# Patient Record
Sex: Female | Born: 1995 | Race: Black or African American | Hispanic: No | Marital: Single
Health system: Southern US, Community
[De-identification: ages and names within clinical notes are randomized; demographics above are authoritative.]

## PROBLEM LIST (undated history)

## (undated) ENCOUNTER — Inpatient Hospital Stay (HOSPITAL_COMMUNITY): Payer: Self-pay

## (undated) DIAGNOSIS — A749 Chlamydial infection, unspecified: Secondary | ICD-10-CM

## (undated) DIAGNOSIS — Z789 Other specified health status: Secondary | ICD-10-CM

## (undated) HISTORY — PX: NO PAST SURGERIES: SHX2092

---
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
## 2017-01-22 IMAGING — US US MFM OB FOLLOW-UP
1 series · 14 of 28 positions shown · non-contrast
Comparison: none

[Series 1: us mfm ob follow-up · 48 acquisitions, 14 frames shown]
[im 2/48]
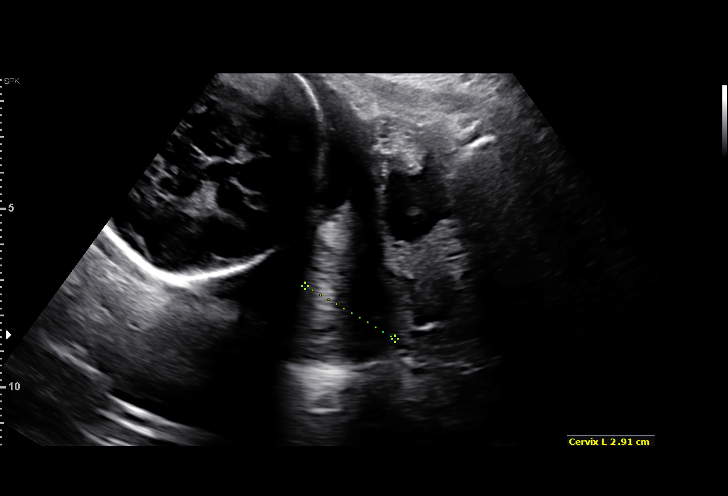
[im 6/48]
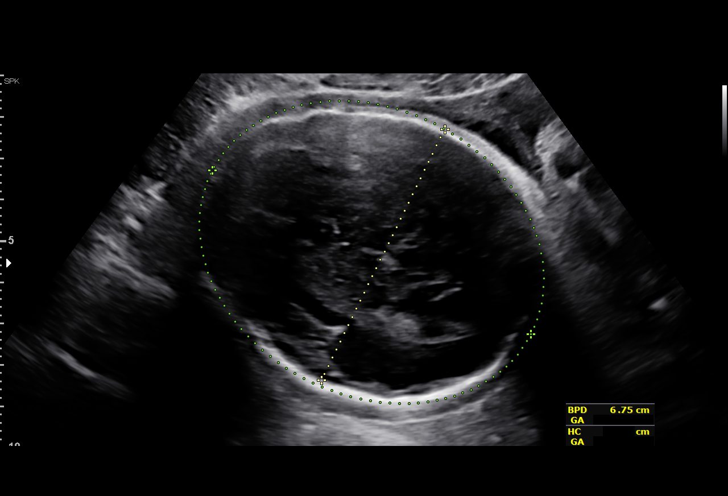
[im 9/48]
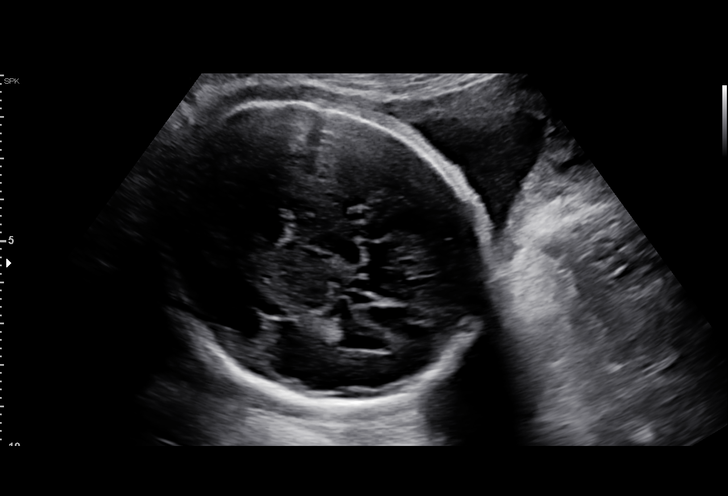
[im 13/48]
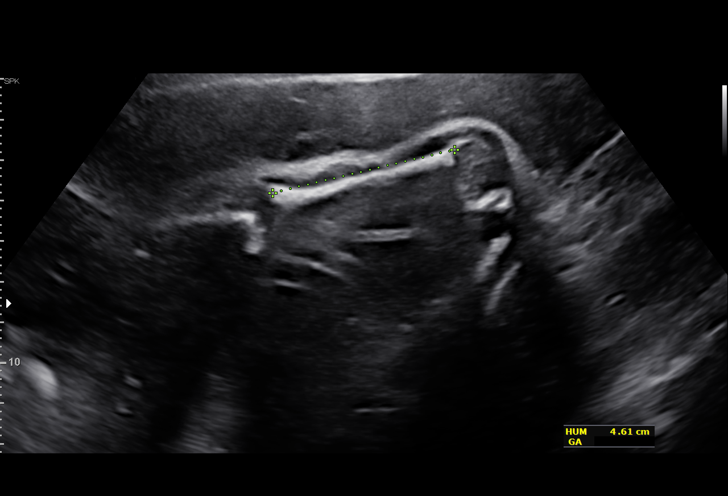
[im 16/48]
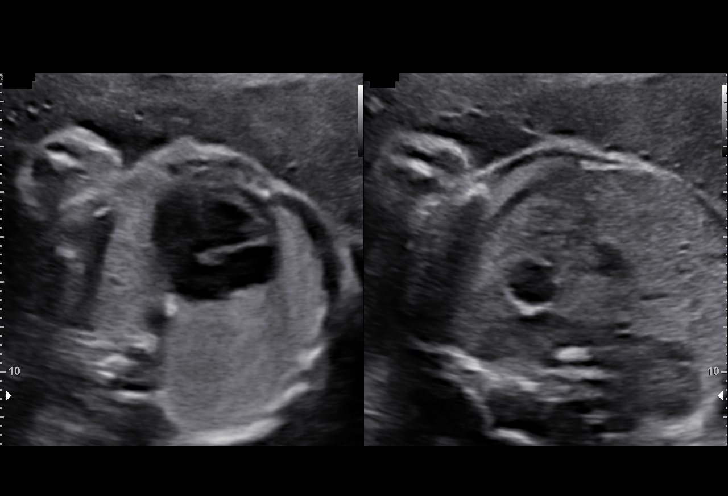
[im 20/48]
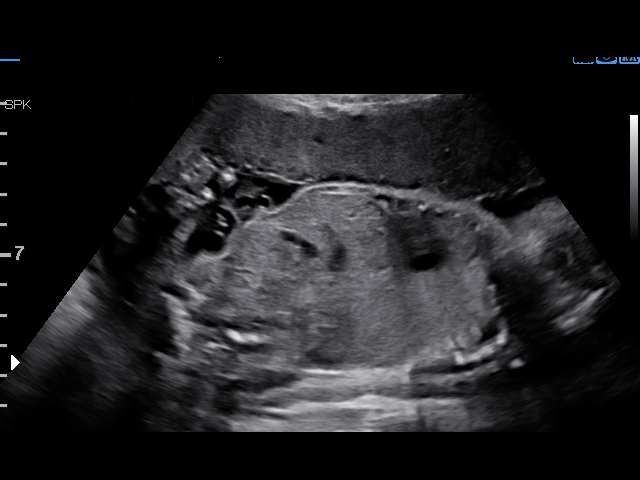
[im 23/48]
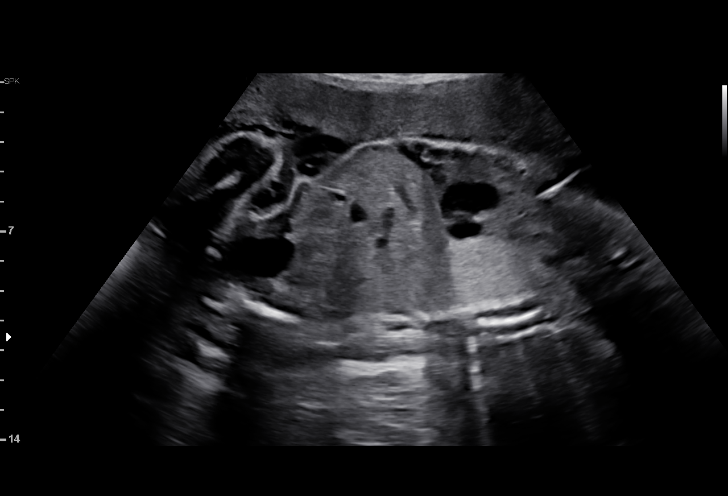
[im 27/48]
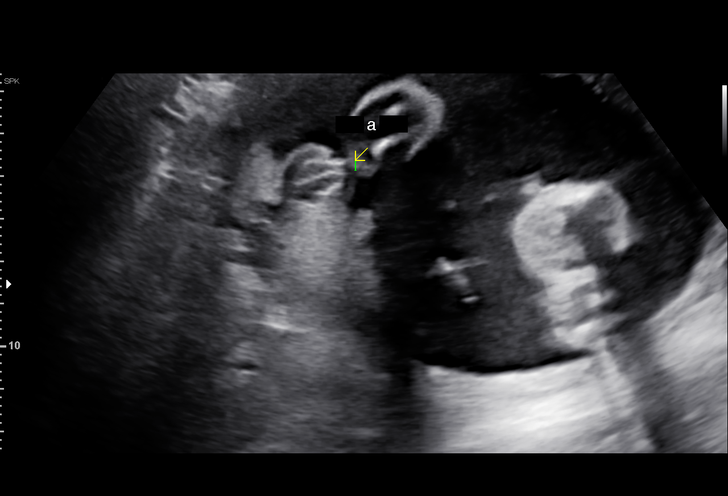
[im 30/48]
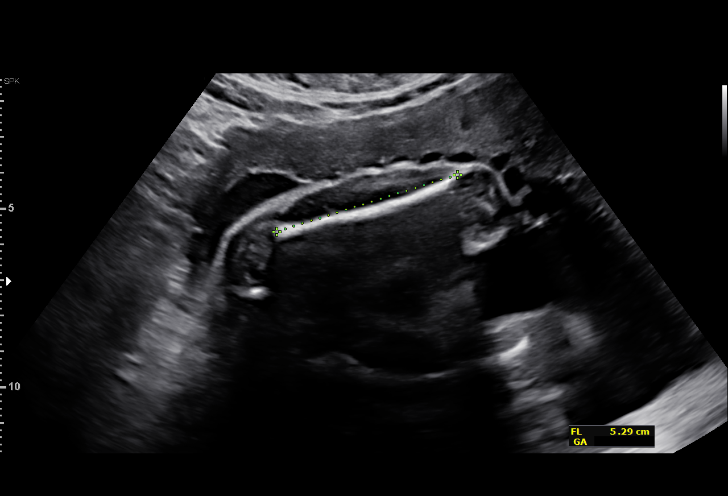
[im 34/48]
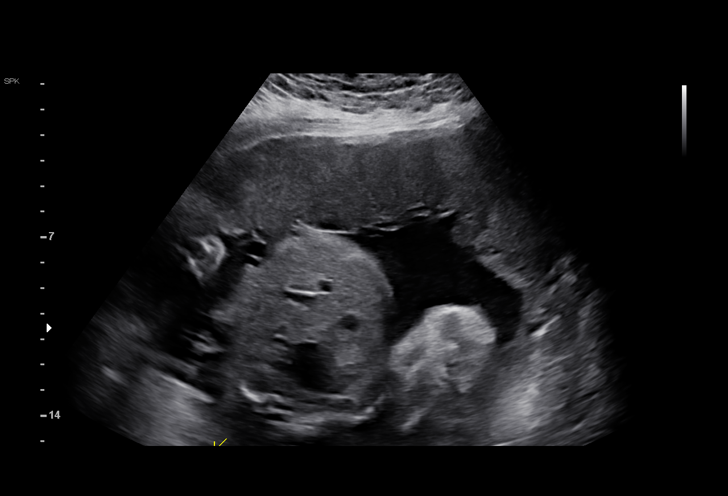
[im 37/48]
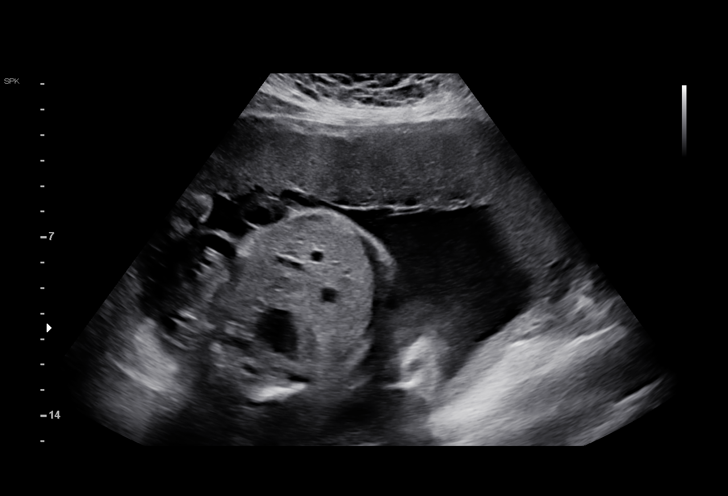
[im 41/48]
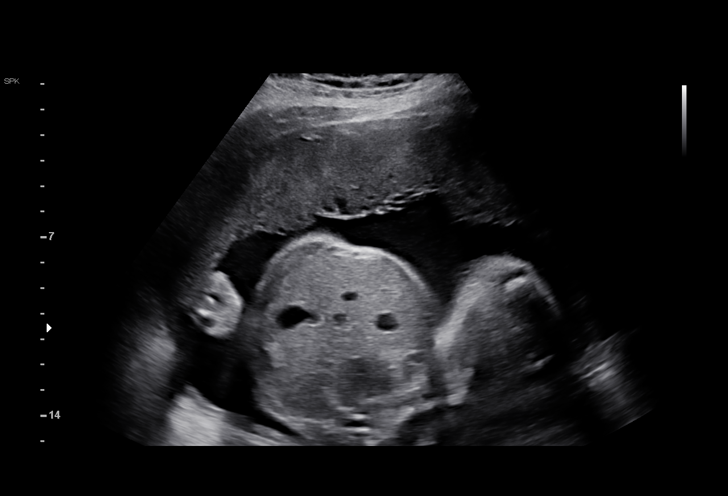
[im 44/48]
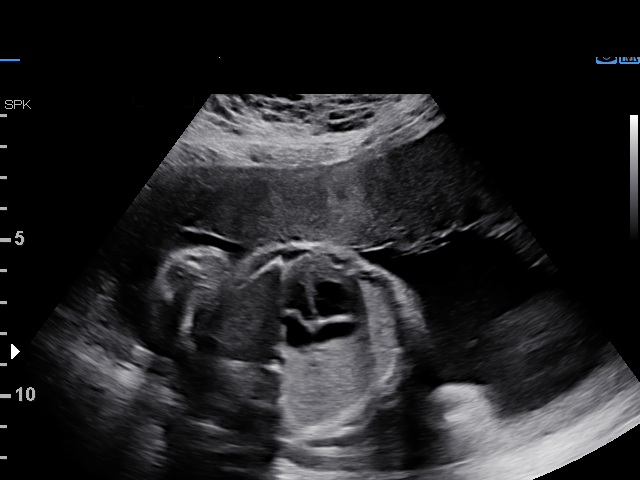
[im 48/48]
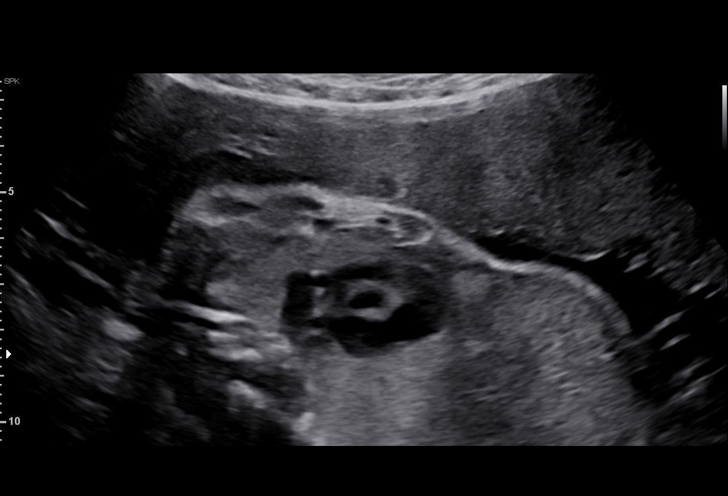

[14 of 28 positions shown; findings below may reference images not displayed]

([HOSPITAL])
[REDACTED] [HOSPITAL]

Indications

28 weeks gestation of pregnancy
Teen pregnancy
OB History

Blood Type:            Height:  4'11"  Weight (lb):  177      BMI:
Gravidity:    1         Term:   0        Prem:   0        SAB:   0
TOP:          0       Ectopic:  0        Living: 0
Fetal Evaluation

Num Of Fetuses:     1
Fetal Heart         160
Rate(bpm):
Cardiac Activity:   Observed
Presentation:       Cephalic
Placenta:           Anterior, above cervical os

Amniotic Fluid
AFI FV:      Subjectively within normal limits

Largest Pocket(cm)
4.74
Biometry

BPD:      67.7  mm     G. Age:  27w 2d         14  %    CI:        77.58   %   70 - 86
FL/HC:      20.6   %   18.8 -
HC:      243.3  mm     G. Age:  26w 3d        < 3  %    HC/AC:      1.01       1.05 -
AC:      241.6  mm     G. Age:  28w 3d         51  %    FL/BPD:     74.0   %   71 - 87
FL:       50.1  mm     G. Age:  27w 0d          9  %    FL/AC:      20.7   %   20 - 24
HUM:      44.9  mm     G. Age:  26w 4d         12  %

Est. FW:    8889  gm      2 lb 7 oz     41  %
Gestational Age

LMP:           28w 1d       Date:   01/25/16                 EDD:   10/31/16
U/S Today:     27w 2d                                        EDD:   11/06/16
Best:          28w 1d    Det. By:   LMP  (01/25/16)          EDD:   10/31/16
Anatomy

Cranium:               Appears normal         Aortic Arch:            Previously seen
Cavum:                 Appears normal         Ductal Arch:            Previously seen
Ventricles:            Appears normal         Diaphragm:              Appears normal
Choroid Plexus:        Previously seen        Stomach:                Appears normal, left
sided
Cerebellum:            Previously seen        Abdomen:                Previously seen
Posterior Fossa:       Previously seen        Abdominal Wall:         Previously seen
Nuchal Fold:           Not applicable (>20    Cord Vessels:           Previously seen
wks GA)
Face:                  Orbits and profile     Kidneys:                Appear normal
previously seen
Lips:                  Appears normal         Bladder:                Appears normal
Thoracic:              Appears normal         Spine:                  Previously seen
Heart:                 Appears normal         Upper Extremities:      Previously seen
(4CH, axis, and situs
RVOT:                  Appears normal         Lower Extremities:      Previously seen
LVOT:                  Appears normal

Other:  Female gender previously seen.  Heels and 5th digit previously seen.
Cervix Uterus Adnexa

Cervix
Length:            2.8  cm.
Normal appearance by transabdominal scan.
Impression

Singleton intrauterine pregnancy at 28+1 weeks, here to
recheck rate of fetal growth.
Review of the anatomy shows no sonographic markers for
aneuploidy or structural anomalies
Amniotic fluid volume is normal
Estimated fetal weight is 8889 g which is growth in the 41st
percentile
Recommendations

Growth remains adequate. Follow-up ultrasounds as clinically
indicated.

## 2017-06-19 ENCOUNTER — Ambulatory Visit (HOSPITAL_COMMUNITY)
Admission: EM | Admit: 2017-06-19 | Discharge: 2017-06-19 | Disposition: A | Discharge status: Home / Self Care | Payer: Medicaid Other | Attending: Physician Assistant | Admitting: Physician Assistant

## 2017-06-19 ENCOUNTER — Encounter (HOSPITAL_COMMUNITY): Payer: Self-pay | Admitting: Emergency Medicine

## 2017-06-19 DIAGNOSIS — W2209XA Striking against other stationary object, initial encounter: Secondary | ICD-10-CM | POA: Diagnosis not present

## 2017-06-19 DIAGNOSIS — S90222A Contusion of left lesser toe(s) with damage to nail, initial encounter: Secondary | ICD-10-CM | POA: Diagnosis not present

## 2017-06-19 NOTE — ED Provider Notes (Signed)
[facility]    CSN: [id number] Arrival date & time: 06/19/17  1627     History   Chief Complaint Chief Complaint  Patient presents with  . Toe Injury    HPI Jacqueline [name] is a 21 y.o. female.   21 year-old female, presenting today complaining of pain to the left fourth toe. States that she was walking down the stairs last night and hit the toe on a piece of metal. States that she cracked the toenail. She is mostly having pain to the toe. Unable to go to work today due to pain Tdap up to date    The history is provided by the patient.  Toe Pain  This is a new problem. The current episode started yesterday. The problem occurs constantly. The problem has not changed since onset.Pertinent negatives include no chest pain, no abdominal pain, no headaches and no shortness of breath. The symptoms are aggravated by walking. Nothing relieves the symptoms. She has tried nothing for the symptoms. The treatment provided no relief.    Past Medical History:  Diagnosis Date  . Chlamydia   . Medical history non-contributory     Patient Active Problem List   Diagnosis Date Noted  . Normal labor 10/27/2016  . Chlamydia infection affecting pregnancy in third trimester, antepartum 10/10/2016  . GBS (group B Streptococcus carrier), +RV culture, currently pregnant 10/10/2016  . Supervision of normal first teen pregnancy 04/01/2016  . Pregnant 03/14/2016  . Iron deficiency anemia 01/30/2015  . Vitamin D deficiency 01/30/2015  . Acanthosis nigricans 01/30/2015  . 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 Attention deficit disorder 06/11/2013    Past Surgical History:  Procedure Laterality Date  . NO PAST SURGERIES      OB History    Gravida Para Term Preterm AB Living   1 1 1     1    SAB TAB Ectopic Multiple Live Births         0 1       Home Medications    Prior to Admission medications   Medication Sig Start Date End Date Taking? Authorizing Provider    acetaminophen (TYLENOL) 325 MG tablet Take 2 tablets (650 mg total) by mouth every 4 (four) hours as needed (for pain scale < 4). Patient not taking: Reported on 12/12/2016 10/29/16   Lorne Skeens, MD  butalbital-acetaminophen-caffeine (FIORICET, ESGIC) [phone] MG tablet Take 1-2 tablets by mouth every 6 (six) hours as needed. 12/12/16   Orvilla Cornwall A, CNM  ferrous sulfate 325 (65 FE) MG tablet Take 325 mg by mouth daily with breakfast.    [provider]  hydrocortisone cream 1 % Apply 1 application topically 2 (two) times daily.    [provider]  ibuprofen (ADVIL,MOTRIN) 600 MG tablet Take 1 tablet (600 mg total) by mouth every 6 (six) hours. Patient not taking: Reported on 12/12/2016 10/29/16   Lorne Skeens, MD  Norethindrone-Ethinyl Estradiol-Fe Biphas (LO LOESTRIN FE) 1 MG-10 MCG / 10 MCG tablet Take 1 tablet by mouth daily. Take 1 tablet by mouth daily. 12/12/16   Roe Coombs, CNM  Prenatal Vit-Fe Fumarate-FA (PRENATAL MULTIVITAMIN) TABS tablet Take 1 tablet by mouth daily at 12 noon.    [provider]    Family History Family History  Problem Relation Age of Onset  . Hypertension Mother   . Hyperlipidemia Mother   . ADD / ADHD Brother     Social History Social History   Tobacco Use  .  Smoking status: Passive Smoke Exposure - Never Smoker  . Smokeless tobacco: Never Used  Substance Use Topics  . Alcohol use: No  . Drug use: No     Allergies   Pineapple   Review of Systems Review of Systems  Constitutional: Negative for chills and fever.  HENT: Negative for ear pain and sore throat.   Eyes: Negative for pain and visual disturbance.  Respiratory: Negative for cough and shortness of breath.   Cardiovascular: Negative for chest pain and palpitations.  Gastrointestinal: Negative for abdominal pain and vomiting.  Genitourinary: Negative for dysuria and hematuria.  Musculoskeletal: Positive for joint swelling  (left fourth toe ). Negative for arthralgias and back pain.  Skin: Negative for color change and rash.  Neurological: Negative for seizures, syncope and headaches.  All other systems reviewed and are negative.    Physical Exam Triage Vital Signs ED Triage Vitals  Enc Vitals Group     BP 06/19/17 1651 115/76     Pulse Rate 06/19/17 1651 89     Resp 06/19/17 1651 16     Temp 06/19/17 1651 98.4 F (36.9 C)     Temp Source 06/19/17 1651 Oral     SpO2 06/19/17 1651 98 %     Weight 06/19/17 1649 185 lb (83.9 kg)     Height 06/19/17 1649 5' (1.524 m)     Head Circumference --      Peak Flow --      Pain Score 06/19/17 1649 7     Pain Loc --      Pain Edu? --      Excl. in GC? --    No data found.  Updated Vital Signs BP 115/76   Pulse 89   Temp 98.4 F (36.9 C) (Oral)   Resp 16   Ht 5' (1.524 m)   Wt 185 lb (83.9 kg)   LMP 05/23/2017   SpO2 98%   BMI 36.13 kg/m   Visual Acuity Right Eye Distance:   Left Eye Distance:   Bilateral Distance:    Right Eye Near:   Left Eye Near:    Bilateral Near:     Physical Exam  Constitutional: She appears well-developed and well-nourished. No distress.  HENT:  Head: Normocephalic and atraumatic.  Eyes: Conjunctivae are normal.  Neck: Neck supple.  Cardiovascular: Normal rate and regular rhythm.  No murmur heard. Pulmonary/Chest: Effort normal and breath sounds normal. No respiratory distress.  Abdominal: Soft. There is no tenderness.  Musculoskeletal: She exhibits no edema.       Left foot: There is tenderness and swelling.       Feet:  Swelling noted to the left fourth toe. Small crack in the distal portion of the nail. No subungual hematoma    Neurological: She is alert.  Skin: Skin is warm and dry.  Psychiatric: She has a normal mood and affect.  Nursing note and vitals reviewed.    UC Treatments / Results  Labs (all labs ordered are listed, but only abnormal results are displayed) Labs Reviewed - No data to  display  EKG  EKG Interpretation None       Radiology No results found.  Procedures Procedures (including critical care time)  Medications Ordered in UC Medications - No data to display   Initial Impression / Assessment and Plan / UC Course  I have reviewed the triage vital signs and the nursing notes.  Pertinent labs & imaging results that were available during my care of  the patient were reviewed by me and considered in my medical decision making (see chart for details).     Left fourth toe injury XR offered. She refused. Would rather just have the toe buddy taped and needs a work note   Final Clinical Impressions(s) / UC Diagnoses   Final diagnoses:  Contusion of lesser toe of left foot with damage to nail, initial encounter    ED Discharge Orders    None       Controlled Substance Prescriptions [place] Controlled Substance Registry consulted? Not Applicable   Alecia LemmingBlue, Denyla Cortese C, [place]PA-C 06/19/17 1705

## 2017-06-19 NOTE — ED Triage Notes (Signed)
PT hit left foot on metal last night. Left fourth toenail is cracked.
